# Patient Record
Sex: Male | Born: 2000 | Race: Black or African American | Hispanic: No | Marital: Single | Smoking: Never smoker
Health system: Southern US, Community
[De-identification: ages and names within clinical notes are randomized; demographics above are authoritative.]

---
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
## 2023-07-29 ENCOUNTER — Ambulatory Visit: Payer: Self-pay | Admitting: Podiatry
